# Patient Record
Sex: Female | Born: 2000 | Hispanic: Yes | Marital: Single
Health system: Southern US, Community
[De-identification: ages and names within clinical notes are randomized; demographics above are authoritative.]

---
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
## 2019-10-30 ENCOUNTER — Ambulatory Visit: Payer: Self-pay | Attending: Internal Medicine

## 2019-10-30 ENCOUNTER — Other Ambulatory Visit: Payer: Self-pay

## 2019-10-30 ENCOUNTER — Ambulatory Visit: Payer: Medicaid Other

## 2019-10-30 DIAGNOSIS — Z23 Encounter for immunization: Secondary | ICD-10-CM

## 2019-10-30 NOTE — Progress Notes (Signed)
   Covid-[facility] Vaccination [facility]  Name:  Aimee Gibbs    MRN: [id number] DOB: 2001/02/26  10/30/2019  [name] was observed post Covid-19 immunization for 15 minutes without incident. She was provided with Vaccine Information Sheet and instruction to access the V-Safe system.   [name] was instructed to call 911 with any severe reactions post vaccine: Marland Kitchen Difficulty breathing  . Swelling of face and throat  . A fast heartbeat  . A bad rash all over body  . Dizziness and weakness   Immunizations Administered    Name Date Dose VIS Date Route   Pfizer COVID-19 Vaccine 10/30/2019 10:00 AM 0.3 mL 08/30/2018 Intramuscular   Manufacturer: [employer], [employer]   Lot: [id number]   NDC: [address]-9

## 2019-11-21 ENCOUNTER — Ambulatory Visit: Payer: Self-pay | Attending: Internal Medicine

## 2019-11-21 DIAGNOSIS — Z23 Encounter for immunization: Secondary | ICD-10-CM

## 2019-11-21 NOTE — Progress Notes (Signed)
   Covid-19 Vaccination Clinic  Name:  Kanitra Purifoy    MRN: [id number] DOB: [birth year]  11/21/2019  [name] was observed post Covid-19 immunization for 15 minutes without incident. She was provided with Vaccine Information Sheet and instruction to access the V-Safe system.   Ms. Lyrica Mcclarty was instructed to call 911 with any severe reactions post vaccine: Marland Kitchen Difficulty breathing  . Swelling of face and throat  . A fast heartbeat  . A bad rash all over body  . Dizziness and weakness   Immunizations Administered    Name Date Dose VIS Date Route   Pfizer COVID-19 Vaccine 11/21/2019  2:16 PM 0.3 mL 08/30/2018 Intramuscular   Manufacturer: [employer], [employer]   Lot: [id number]   NDC: [address]-3
